# Patient Record
Sex: Female | Born: 1960 | Race: White | Hispanic: No | Marital: Married | Smoking: Never smoker
Health system: Southern US, Community
[De-identification: ages and names within clinical notes are randomized; demographics above are authoritative.]

## PROBLEM LIST (undated history)

## (undated) DIAGNOSIS — E785 Hyperlipidemia, unspecified: Secondary | ICD-10-CM

## (undated) DIAGNOSIS — I10 Essential (primary) hypertension: Secondary | ICD-10-CM

## (undated) DIAGNOSIS — Q279 Congenital malformation of peripheral vascular system, unspecified: Secondary | ICD-10-CM

## (undated) DIAGNOSIS — I700 Atherosclerosis of aorta: Secondary | ICD-10-CM

## (undated) DIAGNOSIS — M8580 Other specified disorders of bone density and structure, unspecified site: Secondary | ICD-10-CM

## (undated) DIAGNOSIS — R231 Pallor: Secondary | ICD-10-CM

## (undated) HISTORY — PX: SPLENECTOMY: SUR1306

## (undated) HISTORY — DX: Other specified disorders of bone density and structure, unspecified site: M85.80

## (undated) HISTORY — DX: Congenital malformation of peripheral vascular system, unspecified: Q27.9

## (undated) HISTORY — DX: Essential (primary) hypertension: I10

## (undated) HISTORY — DX: Pallor: R23.1

## (undated) HISTORY — DX: Hyperlipidemia, unspecified: E78.5

## (undated) HISTORY — DX: Atherosclerosis of aorta: I70.0

---

## 1998-09-08 HISTORY — PX: ENDOMETRIAL ABLATION: SHX621

## 2000-09-08 HISTORY — PX: GASTRIC BYPASS: SHX52

## 2012-09-08 HISTORY — PX: LITHOTRIPSY: SUR834

## 2017-05-14 DIAGNOSIS — L409 Psoriasis, unspecified: Secondary | ICD-10-CM

## 2017-05-14 HISTORY — DX: Psoriasis, unspecified: L40.9

## 2017-07-21 ENCOUNTER — Other Ambulatory Visit: Payer: Self-pay | Admitting: Family Medicine

## 2017-07-21 DIAGNOSIS — Z1231 Encounter for screening mammogram for malignant neoplasm of breast: Secondary | ICD-10-CM

## 2017-09-15 ENCOUNTER — Ambulatory Visit
Admission: RE | Admit: 2017-09-15 | Discharge: 2017-09-15 | Disposition: A | Discharge status: Home / Self Care | Payer: BLUE CROSS/BLUE SHIELD | Source: Ambulatory Visit | Attending: Family Medicine | Admitting: Family Medicine

## 2017-09-15 ENCOUNTER — Encounter: Payer: Self-pay | Admitting: Radiology

## 2017-09-15 DIAGNOSIS — Z1231 Encounter for screening mammogram for malignant neoplasm of breast: Secondary | ICD-10-CM | POA: Diagnosis present

## 2017-09-22 ENCOUNTER — Other Ambulatory Visit: Payer: Self-pay | Admitting: *Deleted

## 2017-09-22 ENCOUNTER — Inpatient Hospital Stay
Admission: RE | Admit: 2017-09-22 | Discharge: 2017-09-22 | Disposition: A | Discharge status: Home / Self Care | Payer: Self-pay | Source: Ambulatory Visit | Attending: *Deleted | Admitting: *Deleted

## 2017-09-22 DIAGNOSIS — Z9289 Personal history of other medical treatment: Secondary | ICD-10-CM

## 2018-02-18 DIAGNOSIS — Z6834 Body mass index (BMI) 34.0-34.9, adult: Secondary | ICD-10-CM | POA: Insufficient documentation

## 2018-02-18 HISTORY — DX: Body mass index (BMI) 34.0-34.9, adult: Z68.34

## 2018-09-21 ENCOUNTER — Other Ambulatory Visit: Payer: Self-pay | Admitting: Family Medicine

## 2018-09-21 DIAGNOSIS — Z1231 Encounter for screening mammogram for malignant neoplasm of breast: Secondary | ICD-10-CM

## 2018-10-18 ENCOUNTER — Ambulatory Visit
Admission: RE | Admit: 2018-10-18 | Discharge: 2018-10-18 | Disposition: A | Discharge status: Home / Self Care | Payer: BLUE CROSS/BLUE SHIELD | Source: Ambulatory Visit | Attending: Family Medicine | Admitting: Family Medicine

## 2018-10-18 DIAGNOSIS — Z1231 Encounter for screening mammogram for malignant neoplasm of breast: Secondary | ICD-10-CM | POA: Insufficient documentation

## 2020-02-15 ENCOUNTER — Other Ambulatory Visit: Payer: Self-pay | Admitting: Family Medicine

## 2020-02-15 DIAGNOSIS — Z1231 Encounter for screening mammogram for malignant neoplasm of breast: Secondary | ICD-10-CM

## 2020-02-21 ENCOUNTER — Ambulatory Visit
Admission: RE | Admit: 2020-02-21 | Discharge: 2020-02-21 | Disposition: A | Discharge status: Home / Self Care | Payer: 59 | Source: Ambulatory Visit | Attending: Family Medicine | Admitting: Family Medicine

## 2020-02-21 DIAGNOSIS — Z1231 Encounter for screening mammogram for malignant neoplasm of breast: Secondary | ICD-10-CM | POA: Diagnosis not present

## 2020-07-09 ENCOUNTER — Ambulatory Visit: Payer: 59 | Admitting: Podiatry

## 2020-07-09 ENCOUNTER — Other Ambulatory Visit: Payer: Self-pay

## 2020-07-09 DIAGNOSIS — Z862 Personal history of diseases of the blood and blood-forming organs and certain disorders involving the immune mechanism: Secondary | ICD-10-CM | POA: Insufficient documentation

## 2020-07-09 DIAGNOSIS — K219 Gastro-esophageal reflux disease without esophagitis: Secondary | ICD-10-CM | POA: Insufficient documentation

## 2020-07-09 DIAGNOSIS — G4730 Sleep apnea, unspecified: Secondary | ICD-10-CM

## 2020-07-09 DIAGNOSIS — L4050 Arthropathic psoriasis, unspecified: Secondary | ICD-10-CM

## 2020-07-09 DIAGNOSIS — M19079 Primary osteoarthritis, unspecified ankle and foot: Secondary | ICD-10-CM

## 2020-07-09 DIAGNOSIS — L603 Nail dystrophy: Secondary | ICD-10-CM

## 2020-07-09 DIAGNOSIS — L409 Psoriasis, unspecified: Secondary | ICD-10-CM

## 2020-07-09 DIAGNOSIS — M79674 Pain in right toe(s): Secondary | ICD-10-CM

## 2020-07-09 DIAGNOSIS — T7840XA Allergy, unspecified, initial encounter: Secondary | ICD-10-CM

## 2020-07-09 DIAGNOSIS — M19071 Primary osteoarthritis, right ankle and foot: Secondary | ICD-10-CM

## 2020-07-09 HISTORY — DX: Sleep apnea, unspecified: G47.30

## 2020-07-09 HISTORY — DX: Personal history of diseases of the blood and blood-forming organs and certain disorders involving the immune mechanism: Z86.2

## 2020-07-09 HISTORY — DX: Allergy, unspecified, initial encounter: T78.40XA

## 2020-07-09 HISTORY — DX: Gastro-esophageal reflux disease without esophagitis: K21.9

## 2020-07-09 NOTE — Progress Notes (Signed)
°  Subjective:  Patient ID: Carmen Aguirre, female    DOB: 1961/02/10,  MRN: [id number]  Chief Complaint  Patient presents with   Nail Problem    i have some discoloration on some of the toenails    Foot Problem    the right big toe is hurting and i have some arthritis in it    59 y.o. female presents with the above complaint. History confirmed with patient.  Objective:  Physical Exam: warm, good capillary refill, no trophic changes or ulcerative lesions, normal DP and PT pulses and normal sensory exam.  Right Foot: POP Right 1st MPJ with local edema and crepitus on ROM   No images are attached to the encounter.  Radiographs: Prior [facility] XR reviewed - moderate 1st MPJ arthritis Assessment:   1. Arthritis of big toe   2. Psoriatic arthritis (HCC)   3. Pain in right toe(s)   4. Psoriasis of nail   5. Nail dystrophy      Plan:  Patient was evaluated and treated and all questions answered.  Arthritis -Educated on etiology -Discussed padding and proper shoegear -Injection delivered to the painful joint  Procedure: Joint Injection Location: Right 1st MPJ joint Skin Prep: Alcohol. Injectate: 0.5 cc 1% lidocaine plain, 0.5 cc dexamethasone phosphate. Disposition: Patient tolerated procedure well. Injection site dressed with a band-aid.  Nail dystrophy - fungal vs psoriatic -Discussed etiologies -Patient declines treatment at this time.  Return in about 1 month (around 08/08/2020) for Arthritis, Right.

## 2020-07-24 LAB — COLOGUARD: COLOGUARD: NEGATIVE

## 2020-08-16 ENCOUNTER — Ambulatory Visit: Payer: 59 | Admitting: Podiatry

## 2021-03-29 HISTORY — PX: SHOULDER ARTHROSCOPY W/ ROTATOR CUFF REPAIR: SHX2400

## 2021-06-24 ENCOUNTER — Other Ambulatory Visit: Payer: Self-pay | Admitting: Family Medicine

## 2021-06-24 ENCOUNTER — Other Ambulatory Visit: Payer: Self-pay | Admitting: Family

## 2021-06-24 DIAGNOSIS — Z1231 Encounter for screening mammogram for malignant neoplasm of breast: Secondary | ICD-10-CM

## 2021-07-01 ENCOUNTER — Ambulatory Visit: Payer: 59 | Admitting: Podiatry

## 2021-07-01 ENCOUNTER — Other Ambulatory Visit: Payer: Self-pay

## 2021-07-01 ENCOUNTER — Ambulatory Visit (INDEPENDENT_AMBULATORY_CARE_PROVIDER_SITE_OTHER): Payer: 59

## 2021-07-01 DIAGNOSIS — M19079 Primary osteoarthritis, unspecified ankle and foot: Secondary | ICD-10-CM | POA: Diagnosis not present

## 2021-07-01 DIAGNOSIS — L4050 Arthropathic psoriasis, unspecified: Secondary | ICD-10-CM

## 2021-07-01 DIAGNOSIS — M6740 Ganglion, unspecified site: Secondary | ICD-10-CM | POA: Diagnosis not present

## 2021-07-05 NOTE — Progress Notes (Signed)
  Subjective:  Patient ID: Carmen Aguirre, female    DOB: [birth year],  MRN: [id number]  Chief Complaint  Patient presents with   Cyst    Cyst at Rt 1st met x 6 mo - very sore - remains same size - no redness -no injury tx: none    60 y.o. female presents with the above complaint. History confirmed with patient.  Objective:  Physical Exam: warm, good capillary refill, no trophic changes or ulcerative lesions, normal DP and PT pulses and normal sensory exam.  Right Foot: POP Right 1st MPJ with local edema and crepitus on ROM, large cyst dorsal to the 1st metatarsal with fluctuance, no warmth/erythema. Approx 2 cm diameter Assessment:   1. Ganglion cyst   2. Arthritis of big toe   3. Psoriatic arthritis ([place])    Plan:  Patient was evaluated and treated and all questions answered.  Arthritis -Repeat injection delivered 1st MPJ  Procedure: Joint Injection Location: Right 1st MP joint Skin Prep: Alcohol. Injectate: 0.5 cc 1% lidocaine plain, 0.5 cc betamethasone acetate-betamethasone sodium phosphate Disposition: Patient tolerated procedure well. Injection site dressed with a band-aid.  Ganglion cyst -After sterile skin prep the area was anesthetized with 3ccs lidocaine plain. The lesion was aspirated with thick mucous aspirate. This was manually expressed. The skin was cleansed and compression dressing applied. Should this recur would consider excision.  Return in about 1 month (around 08/01/2021) for cyst, arthhritis f/u.

## 2021-07-10 ENCOUNTER — Other Ambulatory Visit: Payer: Self-pay

## 2021-07-10 ENCOUNTER — Ambulatory Visit
Admission: RE | Admit: 2021-07-10 | Discharge: 2021-07-10 | Disposition: A | Discharge status: Home / Self Care | Payer: 59 | Source: Ambulatory Visit | Attending: Family | Admitting: Family

## 2021-07-10 DIAGNOSIS — Z1231 Encounter for screening mammogram for malignant neoplasm of breast: Secondary | ICD-10-CM | POA: Insufficient documentation

## 2021-07-11 ENCOUNTER — Other Ambulatory Visit: Payer: Self-pay | Admitting: Family

## 2021-07-11 DIAGNOSIS — R928 Other abnormal and inconclusive findings on diagnostic imaging of breast: Secondary | ICD-10-CM

## 2021-07-11 DIAGNOSIS — N6489 Other specified disorders of breast: Secondary | ICD-10-CM

## 2021-07-18 ENCOUNTER — Other Ambulatory Visit: Payer: Self-pay | Admitting: Internal Medicine

## 2021-07-22 ENCOUNTER — Other Ambulatory Visit: Payer: Self-pay | Admitting: Internal Medicine

## 2021-07-22 DIAGNOSIS — R928 Other abnormal and inconclusive findings on diagnostic imaging of breast: Secondary | ICD-10-CM

## 2021-07-22 DIAGNOSIS — N6489 Other specified disorders of breast: Secondary | ICD-10-CM

## 2021-07-25 ENCOUNTER — Other Ambulatory Visit: Payer: Self-pay

## 2021-07-25 ENCOUNTER — Ambulatory Visit
Admission: RE | Admit: 2021-07-25 | Discharge: 2021-07-25 | Disposition: A | Discharge status: Home / Self Care | Payer: 59 | Source: Ambulatory Visit | Attending: Internal Medicine | Admitting: Internal Medicine

## 2021-07-25 DIAGNOSIS — R928 Other abnormal and inconclusive findings on diagnostic imaging of breast: Secondary | ICD-10-CM | POA: Insufficient documentation

## 2021-07-25 DIAGNOSIS — N6489 Other specified disorders of breast: Secondary | ICD-10-CM | POA: Insufficient documentation

## 2021-08-05 ENCOUNTER — Ambulatory Visit: Payer: 59 | Admitting: Podiatry

## 2021-08-05 ENCOUNTER — Other Ambulatory Visit: Payer: Self-pay

## 2021-08-05 DIAGNOSIS — M6740 Ganglion, unspecified site: Secondary | ICD-10-CM | POA: Diagnosis not present

## 2021-08-05 DIAGNOSIS — M19079 Primary osteoarthritis, unspecified ankle and foot: Secondary | ICD-10-CM

## 2021-08-05 DIAGNOSIS — M19071 Primary osteoarthritis, right ankle and foot: Secondary | ICD-10-CM | POA: Diagnosis not present

## 2021-08-05 MED ORDER — BETAMETHASONE SOD PHOS & ACET 6 (3-3) MG/ML IJ SUSP
3.0000 mg | Freq: Once | INTRAMUSCULAR | Status: AC
  Administered 2021-08-05: 09:00:00 3 mg

## 2021-08-05 NOTE — Progress Notes (Signed)
  Subjective:  Patient ID: Carmen Aguirre, female    DOB: [birth year],  MRN: [id number]  Chief Complaint  Patient presents with   Cyst    F/U Rt cyst and arthritis -pt states," the cyst is doing better the arthritis is just the same; 7/10 sharp occasional pain." - w/ occasional swelling - no redness tx" none   60 y.o. female presents with the above complaint. History confirmed with patient.   Objective:  Physical Exam: warm, good capillary refill, no trophic changes or ulcerative lesions, normal DP and PT pulses and normal sensory exam.  Right Foot: POP Right 1st MPJ with local edema and crepitus on ROM, only small palpable cyst dorsal to the 1st metatarsal with fluctuance, no warmth/erythema.   Assessment:   1. Arthritis of big toe   2. Ganglion cyst    Plan:  Patient was evaluated and treated and all questions answered.  Arthritis -Patient had several week relief with injection. Repeat injection today  Procedure: Joint Injection Location: Right 1st MP joint Skin Prep: Alcohol. Injectate: 0.5 cc 1% lidocaine plain, 0.5 cc betamethasone acetate-betamethasone sodium phosphate Disposition: Patient tolerated procedure well. Injection site dressed with a band-aid.  Ganglion cyst -No need for further aspiration. Encouraged daily massage to prevent refill.   Return if symptoms worsen or fail to improve.

## 2022-06-12 ENCOUNTER — Other Ambulatory Visit: Payer: Self-pay | Admitting: Internal Medicine

## 2022-06-12 DIAGNOSIS — Z1231 Encounter for screening mammogram for malignant neoplasm of breast: Secondary | ICD-10-CM

## 2022-07-14 ENCOUNTER — Ambulatory Visit
Admission: RE | Admit: 2022-07-14 | Discharge: 2022-07-14 | Disposition: A | Discharge status: Home / Self Care | Payer: 59 | Source: Ambulatory Visit | Attending: Internal Medicine | Admitting: Internal Medicine

## 2022-07-14 DIAGNOSIS — Z1231 Encounter for screening mammogram for malignant neoplasm of breast: Secondary | ICD-10-CM | POA: Insufficient documentation

## 2023-04-29 LAB — LAB REPORT - SCANNED: EGFR: 79

## 2023-05-04 ENCOUNTER — Encounter: Payer: Self-pay | Admitting: *Deleted

## 2023-05-04 ENCOUNTER — Other Ambulatory Visit: Payer: Self-pay | Admitting: *Deleted

## 2023-05-04 DIAGNOSIS — E669 Obesity, unspecified: Secondary | ICD-10-CM

## 2023-05-04 DIAGNOSIS — N1831 Chronic kidney disease, stage 3a: Secondary | ICD-10-CM

## 2023-05-04 DIAGNOSIS — M310 Hypersensitivity angiitis: Secondary | ICD-10-CM

## 2023-05-04 DIAGNOSIS — L4050 Arthropathic psoriasis, unspecified: Secondary | ICD-10-CM | POA: Insufficient documentation

## 2023-05-04 DIAGNOSIS — M109 Gout, unspecified: Secondary | ICD-10-CM | POA: Insufficient documentation

## 2023-05-04 DIAGNOSIS — M12812 Other specific arthropathies, not elsewhere classified, left shoulder: Secondary | ICD-10-CM

## 2023-05-04 HISTORY — DX: Other specific arthropathies, not elsewhere classified, left shoulder: M12.812

## 2023-05-04 HISTORY — DX: Arthropathic psoriasis, unspecified: L40.50

## 2023-05-04 HISTORY — DX: Hypersensitivity angiitis: M31.0

## 2023-05-04 HISTORY — DX: Obesity, unspecified: E66.9

## 2023-05-04 HISTORY — DX: Chronic kidney disease, stage 3a: N18.31

## 2023-05-05 ENCOUNTER — Encounter: Payer: Self-pay | Admitting: *Deleted

## 2023-05-06 ENCOUNTER — Ambulatory Visit: Payer: Commercial Managed Care - HMO

## 2023-05-06 VITALS — BP 108/64 | HR 72 | Ht 67.0 in | Wt 159.0 lb

## 2023-05-06 DIAGNOSIS — R002 Palpitations: Secondary | ICD-10-CM

## 2023-05-06 NOTE — Assessment & Plan Note (Signed)
Mostly with activity and appears to be a sensation of elevated heart rate that resolves with rest. No other associated serious symptoms. Differential diagnosis would be physiological response to dehydration, anxiety, mild anemia versus underlying cardiac dysrhythmias.  Will obtain a 3-day Zio patch to rule out any underlying abnormal rhythms.  Will obtain a transthoracic echocardiogram to rule out any structural and functional issues with the heart.  Advised her to check blood pressures during these episodes.  Advised to avoid caffeinated drinks and cut back on iced tea consumption and taking more water throughout the day.  Advised to continue monitoring with her PCP for any worsening anemia.  Continue metoprolol XL 25 mg once daily that was started recently.  If the cardiac workup comes back unremarkable, we we will consider obtaining a treadmill stress test to assess her effort tolerance and heart rate response to activity.  Return to clinic based on above test results.

## 2023-05-06 NOTE — Patient Instructions (Signed)
Medication Instructions:  Your physician recommends that you continue on your current medications as directed. Please refer to the Current Medication list given to you today.  *If you need a refill on your cardiac medications before your next appointment, please call your pharmacy*   Lab Work: None Ordered If you have labs (blood work) drawn today and your tests are completely normal, you will receive your results only by: MyChart Message (if you have MyChart) OR A paper copy in the mail If you have any lab test that is abnormal or we need to change your treatment, we will call you to review the results.   Testing/Procedures: Your physician has requested that you have an echocardiogram. Echocardiography is a painless test that uses sound waves to create images of your heart. It provides your doctor with information about the size and shape of your heart and how well your heart's chambers and valves are working. This procedure takes approximately one hour. There are no restrictions for this procedure. Please do NOT wear cologne, perfume, aftershave, or lotions (deodorant is allowed). Please arrive 15 minutes prior to your appointment time.    WHY IS MY DOCTOR PRESCRIBING ZIO? The Zio system is proven and trusted by physicians to detect and diagnose irregular heart rhythms -- and has been prescribed to hundreds of thousands of patients.  The FDA has cleared the Zio system to monitor for many different kinds of irregular heart rhythms. In a study, physicians were able to reach a diagnosis 90% of the time with the Zio system1.  You can wear the Zio monitor -- a small, discreet, comfortable patch -- during your normal day-to-day activity, including while you sleep, shower, and exercise, while it records every single heartbeat for analysis.  1Barrett, P., et al. Comparison of 24 Hour Holter Monitoring Versus 14 Day Novel Adhesive Patch Electrocardiographic Monitoring. American Journal of  Medicine, 2014.  ZIO VS. HOLTER MONITORING The Zio monitor can be comfortably worn for up to 14 days. Holter monitors can be worn for 24 to 48 hours, limiting the time to record any irregular heart rhythms you may have. Zio is able to capture data for the 51% of patients who have their first symptom-triggered arrhythmia after 48 hours.1  LIVE WITHOUT RESTRICTIONS The Zio ambulatory cardiac monitor is a small, unobtrusive, and water-resistant patch--you might even forget you're wearing it. The Zio monitor records and stores every beat of your heart, whether you're sleeping, working out, or showering.     Follow-Up: At [facility], you and your health needs are our priority.  As part of our continuing mission to provide you with exceptional heart care, we have created designated Provider Care Teams.  These Care Teams include your primary Cardiologist (physician) and Advanced Practice Providers (APPs -  Physician Assistants and Nurse Practitioners) who all work together to provide you with the care you need, when you need it.  We recommend signing up for the patient portal called "MyChart".  Sign up information is provided on this After Visit Summary.  MyChart is used to connect with patients for Virtual Visits (Telemedicine).  Patients are able to view lab/test results, encounter notes, upcoming appointments, etc.  Non-urgent messages can be sent to your provider as well.   To learn more about what you can do with MyChart, go to [email or web address].    Your next appointment:   Based on results  The format for your next appointment:   In Person  Provider:   Vern Claude Madireddy MD  Other Instructions NA

## 2023-05-06 NOTE — Progress Notes (Signed)
Cardiology Consultation:    Date:  05/06/2023   ID:  Carmen Aguirre, DOB 08/29/1961, MRN [id number]  PCP:  Lucianne Lei, MD  Cardiologist:  Marlyn Corporal Daria Mcmeekin, MD   Referring MD: Lucianne Lei, MD   Chief Complaint  Patient presents with   Palpitations    New Patient     ASSESSMENT AND PLAN:   62 year old woman with history of hypertension, psoriatic arthritis, GERD, gastric bypass in 2002, splenectomy at age 11, now an ideal weight range by losing 40 pounds in the past year, has been noticing symptoms of palpitations with activity.  Problem List Items Addressed This Visit     Palpitations - Primary    Mostly with activity and appears to be a sensation of elevated heart rate that resolves with rest. No other associated serious symptoms. Differential diagnosis would be physiological response to dehydration, anxiety, mild anemia versus underlying cardiac dysrhythmias.  Will obtain a 3-day Zio patch to rule out any underlying abnormal rhythms.  Will obtain a transthoracic echocardiogram to rule out any structural and functional issues with the heart.  Advised her to check blood pressures during these episodes.  Advised to avoid caffeinated drinks and cut back on iced tea consumption and taking more water throughout the day.  Advised to continue monitoring with her PCP for any worsening anemia.  Continue metoprolol XL 25 mg once daily that was started recently.  If the cardiac workup comes back unremarkable, we we will consider obtaining a treadmill stress test to assess her effort tolerance and heart rate response to activity.  Return to clinic based on above test results.      Relevant Orders   EKG 12-Lead (Completed)   ECHOCARDIOGRAM COMPLETE   LONG TERM MONITOR (3-14 DAYS)      History of Present Illness:    Carmen Aguirre is a 63 y.o. female who is being seen today for the evaluation of palpitations at the request of Lucianne Lei, MD.   She has history of  hypertension, psoriatic arthritis, GERD, past history of splenectomy, gastric bypass in 2002, lithotripsy for renal stones in 2014.  No prior cardiac history.  Now here for evaluation of palpitations going on for the past 3 to 4 months, describes as a sensation of forceful heartbeat worsened with activity and exercise, relieved with rest.  Symptoms seem to have progressed and now occurring on a daily basis.  She tends to notice her heart rates in 110s using her Fitbit.  She has not checked her blood pressure with these episodes.  At times she might feel lightheaded during the symptoms.  Denies any chest pain, shortness of breath, orthopnea.  Denies any syncopal episodes.  She was recently started on metoprolol XL 25 mg once daily and taking consistently.  Denies any blood in urine or stools. Denies any significant body aches. For psoriatic arthritis she had been on meloxicam for multiple months and was recently discontinued given slight increase in her creatinine.  Creatinine normalized once meloxicam has been discontinued.  Over the past year she has lost 40 pounds by altering her diet.  She does not smoke. Drinks 1 or 2 glasses of wine every day. She consumes up to 64 ounces of iced tea that is decaffeinated.  EKG in the clinic today shows sinus rhythm heart rate 81/min, PR interval 170 ms. EKG from August 20 at PCPs office noted sinus rhythm heart rate 67/min, normal PR interval 192 ms, QRS duration 86 ms and QTc normal  Recent lab work from 04/28/2023 noted hemoglobin 10.7, hematocrit 31, platelets 331, WBC 7.7 In comparison hemoglobin was 11.2 on February 13, 2023. 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  Scalp+++, arms, legs, waistline     Psoriatic arthritis (HCC) 05/04/2023   Rotator cuff arthropathy of left shoulder 05/04/2023   Sleep apnea 07/09/2020   Stage 3a chronic kidney disease (HCC) 05/04/2023   Subcutaneous vascular malformation     Past Surgical History:  Procedure Laterality Date   CESAREAN SECTION     X2   ENDOMETRIAL ABLATION  2000   GASTRIC BYPASS  2002   LITHOTRIPSY  2014   SHOULDER ARTHROSCOPY W/ ROTATOR CUFF REPAIR Left 03/29/2021   SPLENECTOMY     Age 71    Current Medications: Current Meds  Medication Sig   fluticasone (FLONASE) 50 MCG/ACT nasal spray Place 2 sprays into both nostrils daily.   Ixekizumab (TALTZ) 80 MG/ML SOAJ Inject 80 mg into the skin every 30 (thirty) days.   lisinopril (ZESTRIL) 20 MG tablet Take 20 mg by mouth daily.   metoprolol succinate (TOPROL-XL) 25 MG 24 hr tablet Take 25 mg by mouth daily.   rosuvastatin (CRESTOR) 10 MG tablet Take 10 mg by mouth daily.     Allergies:   Penicillin g   Social History   Socioeconomic History   Marital status: Married    Spouse name: Not on file   Number of children: Not on file   Years of education: Not on file   Highest education level: Not on file  Occupational History   Not on file  Tobacco Use   Smoking status: Never   Smokeless tobacco: Never  Substance and Sexual Activity   Alcohol use: Yes    Alcohol/week: 7.0 - 14.0 standard drinks of alcohol    Types: 7 - 14 Glasses of wine per week   Drug use: Never   Sexual activity: Not on file  Other Topics Concern   Not on file  Social History  Narrative   Not on file   Social Determinants of Health   Financial Resource Strain: Not on file  Food Insecurity: Not on file  Transportation Needs: Not on file  Physical Activity: Not on file  Stress: Not on file  Social Connections: Not on file     Family History: The patient's family history includes Dementia in her mother; Heart disease in her father and mother; Hypertension in her father; Ovarian cancer in her sister. There is no history of Breast cancer. ROS:   Please see the history of present illness.    All 14 point review of systems negative except as described per history of present illness.  EKGs/Labs/Other Studies Reviewed:    The following studies were reviewed today:   EKG:  EKG Interpretation Date/Time:  Wednesday May 06 2023 08:05:21 EDT Ventricular Rate:  81 PR Interval:  170 QRS Duration:  84 QT Interval:  362 QTC Calculation: 420 R Axis:   -5  Text Interpretation: Normal sinus rhythm Normal ECG No previous ECGs available Confirmed by Huntley Dec reddy [phone]) on 05/06/2023 8:09:35 AM    Recent Labs: No results found for requested labs within last 365 days.  Recent Lipid Panel No results found for: "CHOL", "TRIG", "HDL", "CHOLHDL", "VLDL", "LDLCALC", "LDLDIRECT"  Physical Exam:    VS:  BP 108/64 (BP Location: Right Arm, Patient Position: Sitting)   Pulse 72   Ht 5\' 7"  (1.702 m)   Wt 159 lb (72.1 kg)   SpO2 99%   BMI 24.90 kg/m     Wt Readings from Last 3 Encounters:  05/06/23 159 lb (72.1 kg)  04/28/23 162 lb (73.5 kg)     GENERAL:  Well nourished, well developed in no acute distress NECK: No JVD; No carotid bruits CARDIAC: RRR, S1 and S2 present, no murmurs, no rubs, no gallops CHEST:  Clear to auscultation without rales, wheezing or rhonchi  Extremities: No pitting pedal edema. Pulses bilaterally symmetric with radial 2+ and dorsalis pedis 2+ NEUROLOGIC:  Alert and oriented x 3  Medication Adjustments/Labs and Tests  Ordered: Current medicines are reviewed at length with the patient today.  Concerns regarding medicines are outlined above.  Orders Placed This Encounter  Procedures   LONG TERM MONITOR (3-14 DAYS)   EKG 12-Lead   ECHOCARDIOGRAM COMPLETE   No orders of the defined types were placed in this encounter.   Signed, Cecille Amsterdam, MD, MPH, [facility]. 05/06/2023 8:42 AM    [facility]8/20/2024

## 2023-06-01 ENCOUNTER — Other Ambulatory Visit: Payer: Self-pay | Admitting: Internal Medicine

## 2023-06-01 DIAGNOSIS — Z1231 Encounter for screening mammogram for malignant neoplasm of breast: Secondary | ICD-10-CM

## 2023-06-10 ENCOUNTER — Ambulatory Visit: Payer: Managed Care, Other (non HMO)

## 2023-06-10 DIAGNOSIS — R002 Palpitations: Secondary | ICD-10-CM

## 2023-06-10 LAB — ECHOCARDIOGRAM COMPLETE
Area-P 1/2: 3.19 cm2
S' Lateral: 2.5 cm

## 2023-06-11 ENCOUNTER — Telehealth: Payer: Self-pay

## 2023-06-11 DIAGNOSIS — R002 Palpitations: Secondary | ICD-10-CM

## 2023-06-11 NOTE — Telephone Encounter (Signed)
MyChart message with instructions sent to pt.

## 2023-06-11 NOTE — Telephone Encounter (Signed)
-----   Message from Marlyn Corporal Madireddy sent at 06/11/2023 12:47 PM EDT ----- Results regarding the echocardiogram sent to her on MyChart. Please schedule her for a treadmill exercise stress test at our office. Thank you

## 2023-07-16 ENCOUNTER — Ambulatory Visit
Admission: RE | Admit: 2023-07-16 | Discharge: 2023-07-16 | Disposition: A | Discharge status: Home / Self Care | Payer: Managed Care, Other (non HMO) | Source: Ambulatory Visit | Attending: Internal Medicine | Admitting: Internal Medicine

## 2023-07-16 DIAGNOSIS — Z1231 Encounter for screening mammogram for malignant neoplasm of breast: Secondary | ICD-10-CM | POA: Diagnosis present

## 2023-09-02 IMAGING — MG MM DIGITAL DIAGNOSTIC UNILAT*R* W/ TOMO W/ CAD
6 series · 6 of 18 positions shown · non-contrast
Comparison: Previous exam(s).

CLINICAL DATA: Patient returns after screening study for evaluation
possible RIGHT breast asymmetry.

EXAM:
DIGITAL DIAGNOSTIC UNILATERAL RIGHT MAMMOGRAM WITH TOMOSYNTHESIS AND
CAD
TECHNIQUE: Right digital diagnostic mammography and breast tomosynthesis was
performed. The images were evaluated with computer-aided detection.

[R MLO synth-2D (1 of 2)]
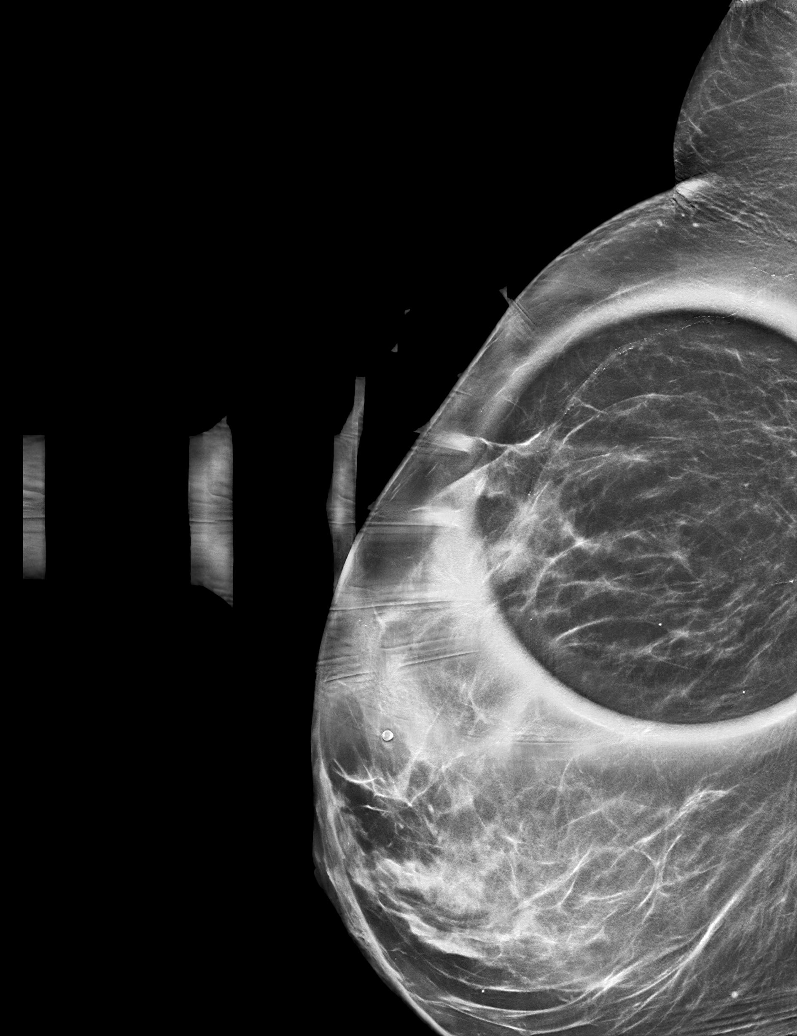

[R MLO synth-2D (2 of 2)]
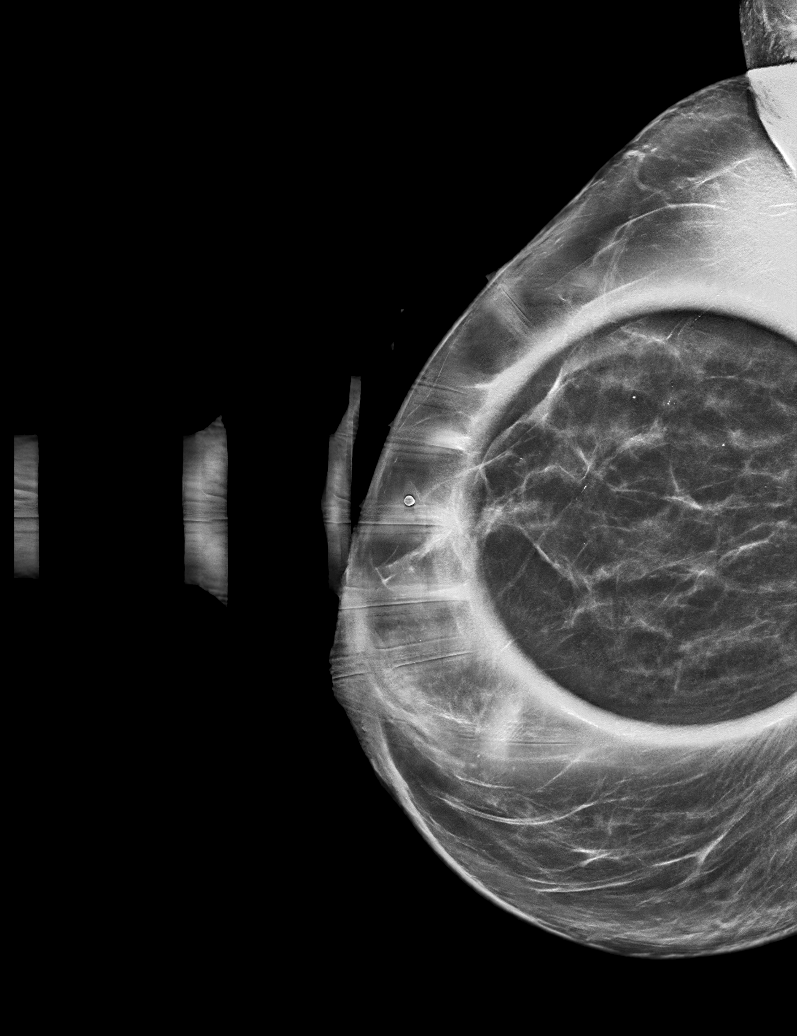

[R ML synth-2D]
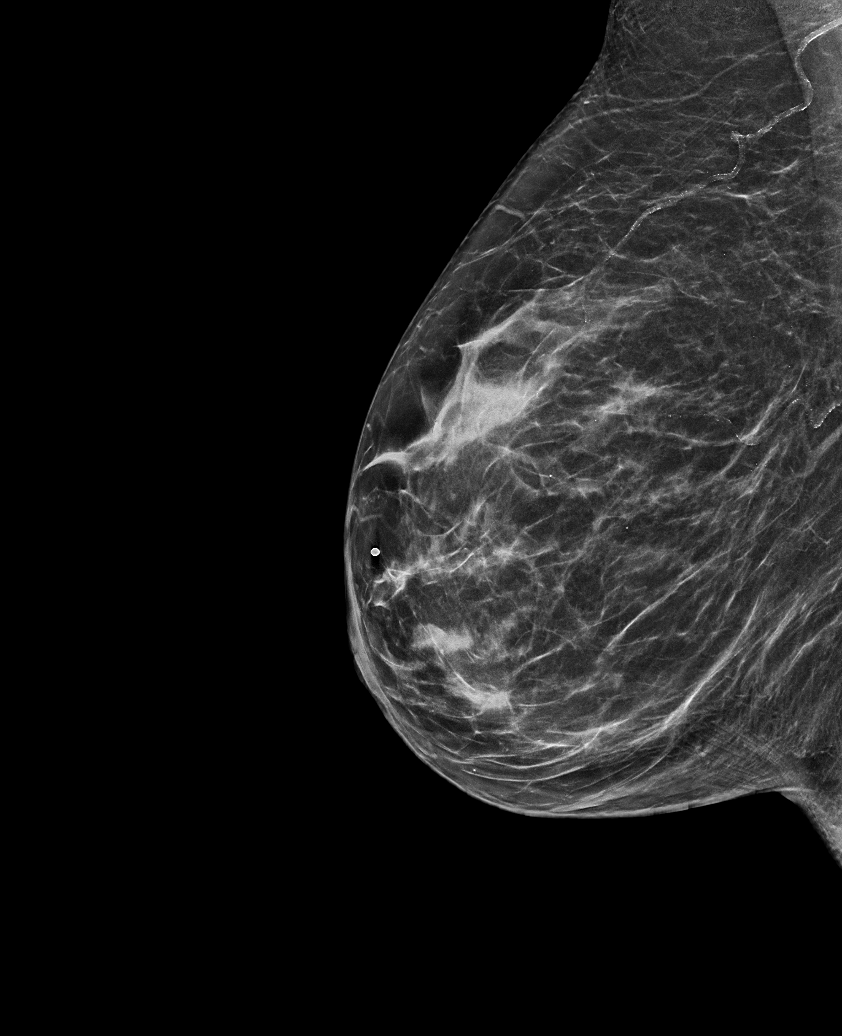

[R MLO tomo (1 of 2) · tomo slice 37/72.0]
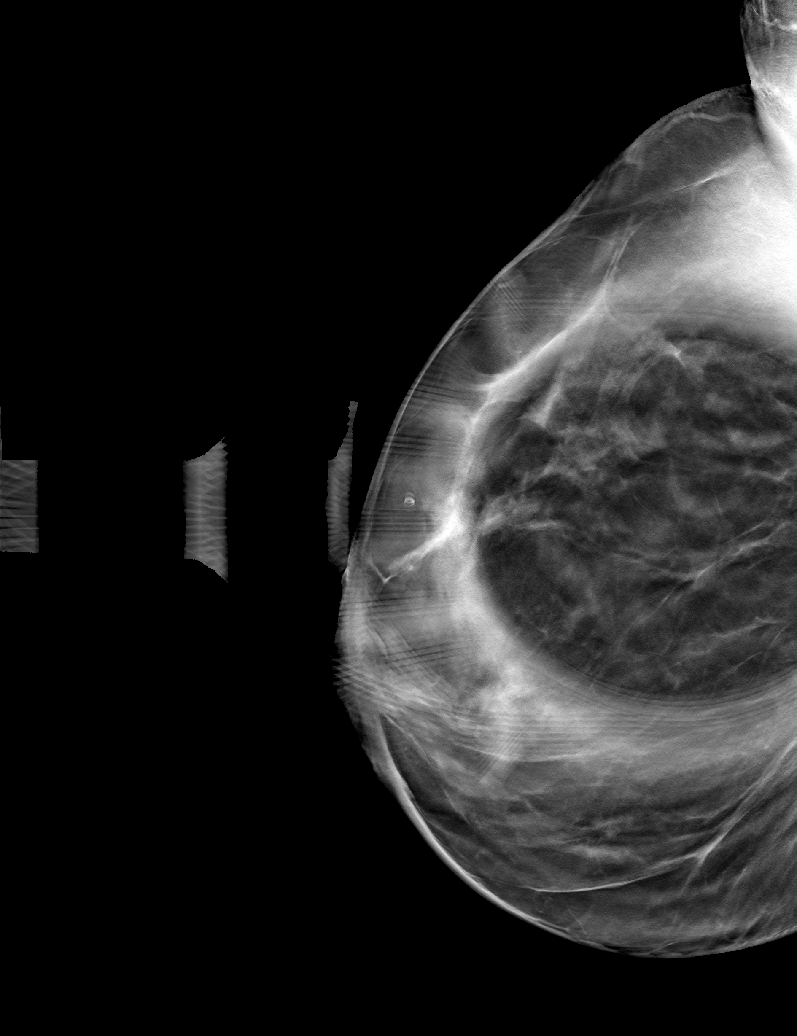

[R MLO tomo (2 of 2) · tomo slice 35/69.0]
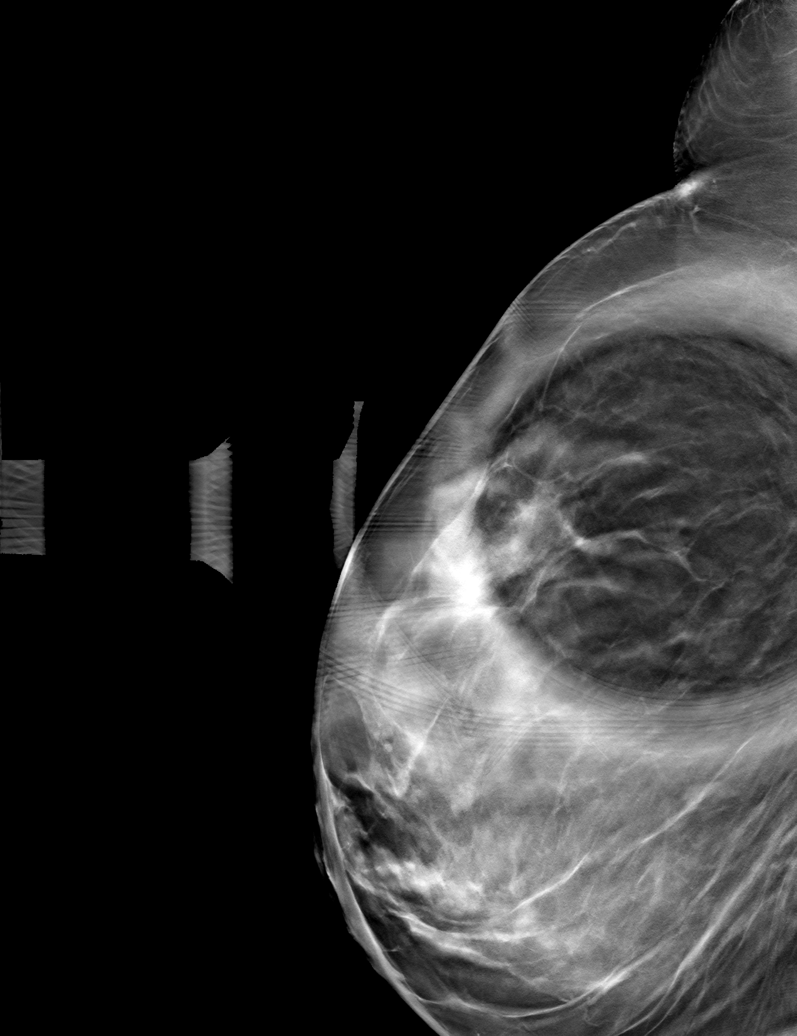

[R ML tomo · tomo slice 36/71.0]
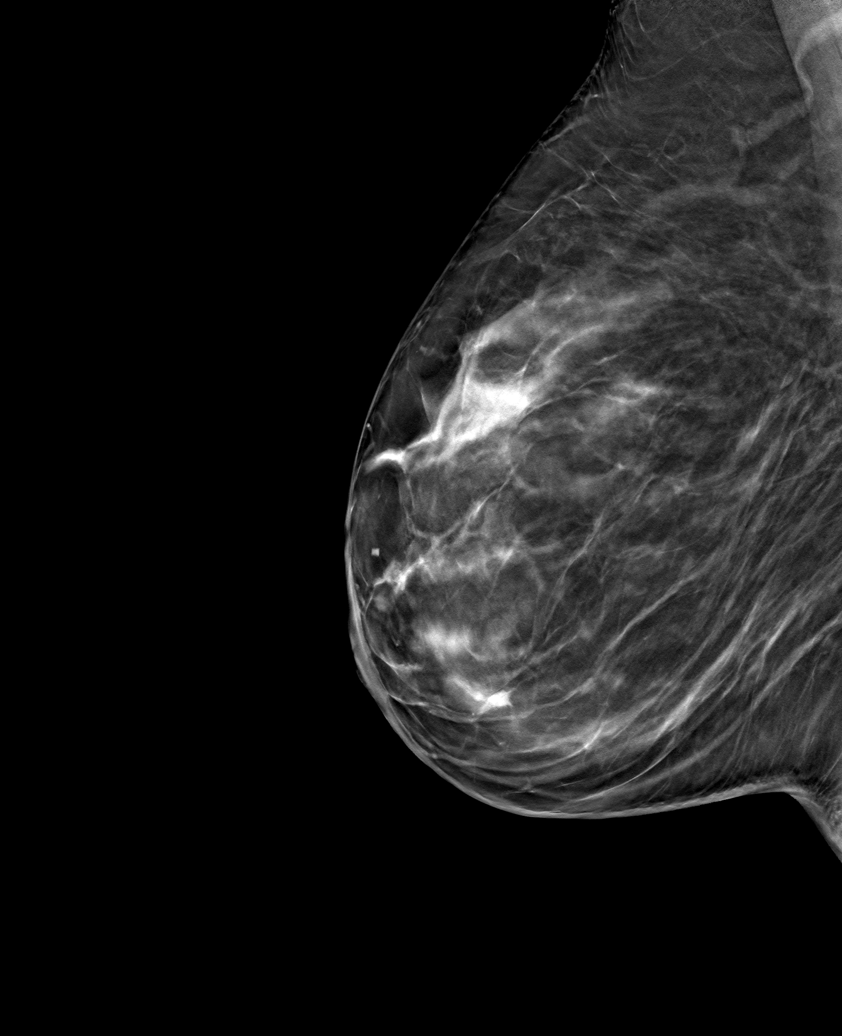

[6 of 18 positions shown; findings below may reference images not displayed]

ACR Breast Density Category c: The breast tissue is heterogeneously
dense, which may obscure small masses.
FINDINGS: Additional 2-D and 3-D images are performed. These views show no
persistent asymmetry in the UPPER portion of the RIGHT breast. No
suspicious mass, distortion, or microcalcifications are identified
to suggest presence of malignancy.
IMPRESSION: No mammographic evidence for malignancy.

RECOMMENDATION:
Screening mammogram in one year.(Code:I3-6-DTU)

I have discussed the findings and recommendations with the patient.
If applicable, a reminder letter will be sent to the patient
regarding the next appointment.

BI-RADS CATEGORY  1: Negative.

## 2023-09-04 LAB — EXTERNAL GENERIC LAB PROCEDURE: COLOGUARD: NEGATIVE

## 2023-09-04 LAB — COLOGUARD: COLOGUARD: NEGATIVE

## 2023-09-30 ENCOUNTER — Ambulatory Visit (INDEPENDENT_AMBULATORY_CARE_PROVIDER_SITE_OTHER): Payer: Commercial Managed Care - HMO

## 2023-09-30 ENCOUNTER — Ambulatory Visit: Payer: Commercial Managed Care - HMO | Admitting: Podiatry

## 2023-09-30 DIAGNOSIS — M6740 Ganglion, unspecified site: Secondary | ICD-10-CM

## 2023-09-30 DIAGNOSIS — M205X1 Other deformities of toe(s) (acquired), right foot: Secondary | ICD-10-CM | POA: Diagnosis not present

## 2023-09-30 DIAGNOSIS — M67471 Ganglion, right ankle and foot: Secondary | ICD-10-CM | POA: Diagnosis not present

## 2023-09-30 DIAGNOSIS — M7751 Other enthesopathy of right foot: Secondary | ICD-10-CM | POA: Diagnosis not present

## 2023-09-30 DIAGNOSIS — M19071 Primary osteoarthritis, right ankle and foot: Secondary | ICD-10-CM

## 2023-09-30 MED ORDER — TRIAMCINOLONE ACETONIDE 10 MG/ML IJ SUSP
10.0000 mg | Freq: Once | INTRAMUSCULAR | Status: AC
  Administered 2023-09-30: 10 mg

## 2023-09-30 NOTE — Progress Notes (Signed)
Chief Complaint  Patient presents with   Toe Pain    Right great toe at the bunion area. Swollen, painful sometimes but not all the time. 2nd and 3rd toes go numb at times, she is interested in draining or shots which ever you think is the best option, SX to be the last option.  Not diabetic, ASA 81 one time a week.    HPI: 63 y.o. female presents today with concern of pain and swelling to the dorsal aspect of the right great toe joint.  Denies injury.  She had been seeing Dr. Samuella Cota for this in 2022 and received cortisone injections which provided improvement until recently.  She would like to hold off on any possible surgery for now.  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  Scalp+++, arms, legs, waistline     Psoriatic arthritis (HCC) 05/04/2023   Rotator cuff arthropathy of left shoulder 05/04/2023   Sleep apnea 07/09/2020   Stage 3a chronic kidney disease (HCC) 05/04/2023   Subcutaneous vascular malformation     Past Surgical History:  Procedure Laterality Date   CESAREAN SECTION     X2   ENDOMETRIAL ABLATION  2000   GASTRIC BYPASS  2002   LITHOTRIPSY  2014   SHOULDER ARTHROSCOPY W/ ROTATOR CUFF REPAIR Left 03/29/2021   SPLENECTOMY     Age 49    Allergies  Allergen Reactions   Penicillin G Other (See Comments) and Rash    fever    Review of Systems  Musculoskeletal:  Positive for joint pain.      Physical Exam: On exam there are palpable pedal pulses to the right foot.  There is minimal localized edema to the dorsal aspect of the  right first MPJ.  There is pain on palpation to the dorsal aspect of the joint and pain with range of motion of this joint.  Pain is mostly with end range dorsiflexion.  There is decreased range of motion at this joint.  No crepitus is noted.  No pain on palpation to the sesamoids.  No erythema or calor is noted.  No actual palpable cyst was noted.  Radiographic Exam (right foot, 3 weightbearing views, 09/30/2023):  Normal osseous mineralization.  Significant joint space narrowing at the right first MPJ.  There is some squaring of the metatarsal head noted.  Dorsal spur noted at the head of the first metatarsal with a slightly avulsed fragment off the dorsal aspect of the base of the first proximal phalanx in this area.  Long first metatarsal noted  Assessment/Plan of Care: 1. Bone spur of right foot   2. Hallux limitus of right foot   3. Arthritis of first metatarsophalangeal (MTP) joint of right foot     Discussed clinical and radiographic findings with patient today.  With the patient's verbal consent and after a sterile skin prep, a course of steroid injection was administered to the right first MPJ using a dorsal approach.  This consisted of a mixture of 1% Xylocaine plain, 0.5% Marcaine plain, Kenalog 10 and Decadron  for total of 1.5 cc administered.  She tolerated this well and a Band-Aid was applied.  She will keep the area covered for 24 hours.  We did review the x-rays.  If she does need to proceed with surgery at any time she should have a shortening and plantarflexed ray osteotomy of the first metatarsal along with cheilectomy of the joint.  This would address the issue and prevent recurrence.  However if any further joint destruction is noted from the lack of cartilage already seen on x-ray today, she may need either a Keller or fusion at that time, depending on how much time is passed between now and when she wants to proceed with any surgery.  Follow-up as needed  Clerance Lav,  DPM, FACFAS [facility]     [address] [place], [place] [address]                Office [phone]  Fax [phone]

## 2024-04-11 ENCOUNTER — Telehealth: Payer: Self-pay

## 2024-04-11 NOTE — Telephone Encounter (Signed)
 Patient called stating she got a call to schedule a stress test, she states she hasn't been seen in almost a year. She want to know if this test is necessary.

## 2024-04-11 NOTE — Telephone Encounter (Signed)
 Spoke with pt and she said someone called to schedule her a stress test and she had one in Oct 2024 that was ok. There are no notes of a call to the pt regarding a test to be scheduled. Advised pt that they would call back if it was for her.

## 2024-06-02 ENCOUNTER — Other Ambulatory Visit: Payer: Self-pay | Admitting: Internal Medicine

## 2024-06-02 DIAGNOSIS — Z1231 Encounter for screening mammogram for malignant neoplasm of breast: Secondary | ICD-10-CM

## 2024-06-09 DIAGNOSIS — K219 Gastro-esophageal reflux disease without esophagitis: Secondary | ICD-10-CM | POA: Diagnosis not present

## 2024-06-09 DIAGNOSIS — K5909 Other constipation: Secondary | ICD-10-CM | POA: Diagnosis not present

## 2024-06-09 DIAGNOSIS — I10 Essential (primary) hypertension: Secondary | ICD-10-CM | POA: Diagnosis not present

## 2024-06-09 DIAGNOSIS — E785 Hyperlipidemia, unspecified: Secondary | ICD-10-CM | POA: Diagnosis not present

## 2024-06-09 DIAGNOSIS — R739 Hyperglycemia, unspecified: Secondary | ICD-10-CM | POA: Diagnosis not present

## 2024-06-09 DIAGNOSIS — G5790 Unspecified mononeuropathy of unspecified lower limb: Secondary | ICD-10-CM | POA: Diagnosis not present

## 2024-06-09 DIAGNOSIS — E559 Vitamin D deficiency, unspecified: Secondary | ICD-10-CM | POA: Diagnosis not present

## 2024-06-09 DIAGNOSIS — Z6821 Body mass index (BMI) 21.0-21.9, adult: Secondary | ICD-10-CM | POA: Diagnosis not present

## 2024-06-09 DIAGNOSIS — J309 Allergic rhinitis, unspecified: Secondary | ICD-10-CM | POA: Diagnosis not present

## 2024-07-26 ENCOUNTER — Ambulatory Visit
Admission: RE | Admit: 2024-07-26 | Discharge: 2024-07-26 | Disposition: A | Discharge status: Home / Self Care | Source: Ambulatory Visit | Attending: Internal Medicine | Admitting: Internal Medicine

## 2024-07-26 DIAGNOSIS — Z1231 Encounter for screening mammogram for malignant neoplasm of breast: Secondary | ICD-10-CM | POA: Diagnosis present
